# Patient Record
Sex: Female | Born: 1970 | Race: White | Hispanic: No | Marital: Single | Smoking: Never smoker
Health system: Southern US, Community
[De-identification: ages and names within clinical notes are randomized; demographics above are authoritative.]

## PROBLEM LIST (undated history)

## (undated) DIAGNOSIS — K219 Gastro-esophageal reflux disease without esophagitis: Secondary | ICD-10-CM

## (undated) HISTORY — DX: Gastro-esophageal reflux disease without esophagitis: K21.9

---

## 1998-01-29 ENCOUNTER — Other Ambulatory Visit: Admission: RE | Admit: 1998-01-29 | Discharge: 1998-01-29 | Payer: Self-pay | Admitting: Obstetrics & Gynecology

## 1999-07-29 ENCOUNTER — Other Ambulatory Visit: Admission: RE | Admit: 1999-07-29 | Discharge: 1999-07-29 | Payer: Self-pay | Admitting: Obstetrics & Gynecology

## 2006-10-19 ENCOUNTER — Emergency Department: Payer: Self-pay | Admitting: Emergency Medicine

## 2006-11-01 ENCOUNTER — Emergency Department: Payer: Self-pay | Admitting: General Practice

## 2006-11-02 ENCOUNTER — Ambulatory Visit: Payer: Self-pay | Admitting: Internal Medicine

## 2006-11-07 ENCOUNTER — Emergency Department: Payer: Self-pay | Admitting: Emergency Medicine

## 2006-11-07 ENCOUNTER — Other Ambulatory Visit: Payer: Self-pay

## 2006-11-18 ENCOUNTER — Ambulatory Visit: Payer: Self-pay | Admitting: Gastroenterology

## 2006-12-08 ENCOUNTER — Ambulatory Visit: Payer: Self-pay | Admitting: Internal Medicine

## 2008-07-24 ENCOUNTER — Ambulatory Visit: Payer: Self-pay

## 2009-09-03 ENCOUNTER — Ambulatory Visit: Payer: Self-pay

## 2009-11-25 ENCOUNTER — Ambulatory Visit: Payer: Self-pay | Admitting: Internal Medicine

## 2010-10-07 ENCOUNTER — Ambulatory Visit: Payer: Self-pay

## 2011-09-20 ENCOUNTER — Emergency Department: Payer: Self-pay | Admitting: Unknown Physician Specialty

## 2011-09-20 LAB — URINALYSIS, COMPLETE
Blood: NEGATIVE
Glucose,UR: NEGATIVE mg/dL (ref 0–75)
Ketone: NEGATIVE
Nitrite: NEGATIVE
Specific Gravity: 1.01 (ref 1.003–1.030)

## 2011-09-20 LAB — BASIC METABOLIC PANEL
Chloride: 109 mmol/L — ABNORMAL HIGH (ref 98–107)
Co2: 24 mmol/L (ref 21–32)
Creatinine: 0.63 mg/dL (ref 0.60–1.30)
EGFR (Non-African Amer.): >60
Glucose: 99 mg/dL (ref 65–99)
Potassium: 4.1 mmol/L (ref 3.5–5.1)
Sodium: 142 mmol/L (ref 136–145)

## 2011-09-20 LAB — CBC
HCT: 44.4 % (ref 35.0–47.0)
HGB: 14.8 g/dL (ref 12.0–16.0)
MCH: 32.3 pg (ref 26.0–34.0)
MCV: 97 fL (ref 80–100)
RBC: 4.59 10*6/uL (ref 3.80–5.20)

## 2011-09-20 LAB — PREGNANCY, URINE: Pregnancy Test, Urine: NEGATIVE m[IU]/mL

## 2011-10-13 ENCOUNTER — Ambulatory Visit: Payer: Self-pay

## 2012-10-21 ENCOUNTER — Ambulatory Visit: Payer: Self-pay

## 2013-10-24 ENCOUNTER — Ambulatory Visit: Payer: Self-pay

## 2014-11-22 ENCOUNTER — Other Ambulatory Visit: Payer: Self-pay | Admitting: Obstetrics and Gynecology

## 2014-11-22 DIAGNOSIS — Z1231 Encounter for screening mammogram for malignant neoplasm of breast: Secondary | ICD-10-CM

## 2014-12-05 ENCOUNTER — Ambulatory Visit
Admission: RE | Admit: 2014-12-05 | Discharge: 2014-12-05 | Disposition: A | Discharge status: Home / Self Care | Payer: BC Managed Care – PPO | Source: Ambulatory Visit | Attending: Obstetrics and Gynecology | Admitting: Obstetrics and Gynecology

## 2014-12-05 DIAGNOSIS — Z1231 Encounter for screening mammogram for malignant neoplasm of breast: Secondary | ICD-10-CM | POA: Diagnosis present

## 2014-12-05 DIAGNOSIS — R928 Other abnormal and inconclusive findings on diagnostic imaging of breast: Secondary | ICD-10-CM | POA: Insufficient documentation

## 2014-12-13 ENCOUNTER — Other Ambulatory Visit: Payer: Self-pay | Admitting: Obstetrics and Gynecology

## 2014-12-13 DIAGNOSIS — R928 Other abnormal and inconclusive findings on diagnostic imaging of breast: Secondary | ICD-10-CM

## 2014-12-21 ENCOUNTER — Ambulatory Visit: Payer: BC Managed Care – PPO

## 2014-12-21 ENCOUNTER — Ambulatory Visit
Admission: RE | Admit: 2014-12-21 | Discharge: 2014-12-21 | Disposition: A | Discharge status: Home / Self Care | Payer: BC Managed Care – PPO | Source: Ambulatory Visit | Attending: Obstetrics and Gynecology | Admitting: Obstetrics and Gynecology

## 2014-12-21 DIAGNOSIS — R928 Other abnormal and inconclusive findings on diagnostic imaging of breast: Secondary | ICD-10-CM | POA: Diagnosis present

## 2015-03-20 ENCOUNTER — Encounter: Payer: Self-pay | Admitting: Dietician

## 2015-03-20 ENCOUNTER — Encounter: Payer: BC Managed Care – PPO | Attending: Obstetrics and Gynecology | Admitting: Dietician

## 2015-03-20 VITALS — Ht 67.0 in | Wt 209.0 lb

## 2015-03-20 DIAGNOSIS — E669 Obesity, unspecified: Secondary | ICD-10-CM

## 2015-03-20 NOTE — Progress Notes (Signed)
Medical Nutrition Therapy: Visit start time: 8:15  end time: 9:15 Assessment:  Diagnosis: obesity Past medical history: GERD Psychosocial issues/ stress concerns: Patient rates her stress as moderate to high and indicates she is dealing "ok" with her stress. Preferred learning method:  . Auditory Current weight: 210.5 lbs  Height: 67 in Medications, supplements: see list Progress and evaluation:  Patient came for initial nutrition assessment. She reports that her weight has remained relatively stable over past 2 years. Reports highest weight of 260 lbs. approximately 4 years ago. 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 She reports that she gained some of the weight back through "poor food choices" and less activity. She reports that she has a very stressful job that also involves 2 hours of commute time daily. She expresses frustration because she states that presently she makes an effort to choose healthy foods but does not see reflected in weight loss. She rarely eats fried foods, likes and includes a variety of fruits/vegetables. She prepares and eats breakfast on her morning commute. She takes her lunch to work and 4 of 7 dinner meals are prepared at home. Patient reports a family history of diabetes and heart disease and wants to take steps to decrease risks. She states that for years, if a meal is delayed, she has symptoms of hypoglycemia. Physical activity: She reports she has not been consistent with exercise but may walk or bike 1-3x/week for 30-60 minutes  Dietary Intake:  Usual eating pattern includes 3 meals and 2 snacks per day. Dining out frequency: 4 meals per week.  Breakfast: boiled egg, [place] or chicken bacon, ww toast (1-2 slices), apple, coffee with stevia and half/half Snack: almonds or cheese Lunch: salad, lean protein; doesn't usually eat a starch, tea with  stevia or water Snack: almonds or cheese or trail mix pkg Supper: baked meat, starch such as ww pasta or brown rice, vegetable or legumes, tea or water Snack: 4-5 oz. Glass of wine 3-4 evenings/week Beverages: water, tea (with stevia), coffee  Nutrition Care Education: Weight control: Instructed on a meal plan to promote weight loss based on 1600 calories including carbohydrate counting, portion control and balance of carbohydrate, protein, non-starchy vegetables with emphasis also on low fat choices. Discussed also spacing of meals and snacks to help decrease episodes of low blood sugar. Also reviewed label reading. Nutritional Diagnosis:  [place]-3.3 Overweight/obesity As related to history of higher fat food choices, larger portions.  As evidenced by diet history.  Intervention:  Balance meals with protein, 2-3 servings of carbohydrate and non-starchy vegetables. Spread 10 servings of carbohydrate over 3 meals and 1-2 snacks. Low impact aerobic exercise- 5 days/week. Read labels for carbohydrate. Can subtract fiber. Include a serving of carbohydrate and 1 oz. of protein for afternoon snack.  Education Materials given:  Marland Kitchen Sample meal pattern/ menus . Goals/ instructions Learner/ who was taught:  . Patient  Level of understanding: . Partial understanding; needs review/ practice Learning barriers: . None Willingness to learn/ readiness for change: . Eager, change in progress  Monitoring and Evaluation:  Dietary intake, exercise, and body weight      follow up: 04/26/15 at 8:15

## 2015-03-20 NOTE — Patient Instructions (Signed)
Balance meals with protein, 2-3 servings of carbohydrate and non-starchy vegetables. Spread 10 servings of carbohydrate over 3 meals and 1-2 snacks. Low impact aerobic exercise- 5 days/week. Read labels for carbohydrate. Can subtract fiber. Include a serving of carbohydrate and 1 oz. of protein for afternoon snack.

## 2015-04-26 ENCOUNTER — Encounter: Payer: Self-pay | Admitting: Dietician

## 2015-04-26 ENCOUNTER — Encounter: Payer: BC Managed Care – PPO | Attending: Obstetrics and Gynecology | Admitting: Dietician

## 2015-04-26 VITALS — Ht 67.0 in | Wt 211.3 lb

## 2015-04-26 DIAGNOSIS — E669 Obesity, unspecified: Secondary | ICD-10-CM | POA: Diagnosis not present

## 2015-04-26 NOTE — Patient Instructions (Addendum)
Work to increase fiber with goal of 25-30 gms per day. Refer to list. Continue to work toward 5 days per week of exercise. Continue with previous goals.

## 2015-04-26 NOTE — Progress Notes (Signed)
Medical Nutrition Therapy Follow-up visit:  Time spent with patient:  8:30- 9:15am Visit #:2 ASSESSMENT:  Diagnosis:obesity  Current weight:211.3    Height: 67 in Medications: See list Medical History: GERD Progress and evaluation:  Patient in for medical nutrition therapy follow-up visit. She states she has used meal plan guide given and instructed on at previous visit. She states she does not always include 9-10 servings of carbohydrate daily. She has added some whole grain foods such as whole grain crackers (triscuits). She averages 4-5 servings of fruits/vegetables/day and her protein intake is adequate. Despite a good intake of fruits/vegetables, overall fiber intake is low due to lack of whole grains. She expressed frustration with lack of weight loss in past month but states that she feels high job stress and not being able to increase her exercise are factors. Pysical activity:2-3 days per week; walk or bikes for 30 mintues  NUTRITION CARE EDUCATION:  Weight control: Acknowledged that with patient's overall healthy eating habits, frustration regarding lack of weight loss is understandable. Discussed how with previous damage to her G-I track, how research is indicating that gut microbes play a role in obesity. 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 Gave and reviewed list of foods and fiber content.  Agree with patient that increased exercise will be necessary in order to be successful with weight loss. INTERVENTION: Work to increase fiber with goal of 25-30 gms per day. Refer to list. Continue to work toward 5 days per week of exercise. Continue with previous goals.   EDUCATION MATERIALS GIVEN:  . List of foods and fiber content . Goals/ instructions LEARNER/ who was taught:  . Patient  LEVEL OF UNDERSTANDING: . Partial understanding; needs review/ practice LEARNING  BARRIERS: . None WILLINGNESS TO LEARN/READINESS FOR CHANGE: . Eager, change in progress  MONITORING AND EVALUATION:  05/29/15 at 9:15am

## 2016-01-17 ENCOUNTER — Other Ambulatory Visit: Payer: Self-pay | Admitting: Obstetrics and Gynecology

## 2016-01-17 DIAGNOSIS — Z1231 Encounter for screening mammogram for malignant neoplasm of breast: Secondary | ICD-10-CM

## 2016-01-31 ENCOUNTER — Ambulatory Visit
Admission: RE | Admit: 2016-01-31 | Discharge: 2016-01-31 | Disposition: A | Discharge status: Home / Self Care | Payer: BC Managed Care – PPO | Source: Ambulatory Visit | Attending: Obstetrics and Gynecology | Admitting: Obstetrics and Gynecology

## 2016-01-31 ENCOUNTER — Other Ambulatory Visit: Payer: Self-pay | Admitting: Obstetrics and Gynecology

## 2016-01-31 DIAGNOSIS — Z1231 Encounter for screening mammogram for malignant neoplasm of breast: Secondary | ICD-10-CM

## 2016-01-31 DIAGNOSIS — R928 Other abnormal and inconclusive findings on diagnostic imaging of breast: Secondary | ICD-10-CM | POA: Diagnosis not present

## 2016-02-07 ENCOUNTER — Encounter: Payer: Self-pay | Admitting: Sports Medicine

## 2016-02-07 ENCOUNTER — Ambulatory Visit (INDEPENDENT_AMBULATORY_CARE_PROVIDER_SITE_OTHER): Payer: BC Managed Care – PPO

## 2016-02-07 ENCOUNTER — Ambulatory Visit (INDEPENDENT_AMBULATORY_CARE_PROVIDER_SITE_OTHER): Payer: BC Managed Care – PPO | Admitting: Sports Medicine

## 2016-02-07 DIAGNOSIS — M79672 Pain in left foot: Secondary | ICD-10-CM

## 2016-02-07 DIAGNOSIS — M722 Plantar fascial fibromatosis: Secondary | ICD-10-CM

## 2016-02-07 DIAGNOSIS — M7662 Achilles tendinitis, left leg: Secondary | ICD-10-CM

## 2016-02-07 MED ORDER — TRIAMCINOLONE ACETONIDE 40 MG/ML IJ SUSP: 20.0000 mg | Freq: Once | INTRAMUSCULAR | Status: AC

## 2016-02-07 MED ORDER — DICLOFENAC SODIUM 75 MG PO TBEC: 75.0000 mg | DELAYED_RELEASE_TABLET | Freq: Two times a day (BID) | ORAL | 0 refills | Status: DC

## 2016-02-07 MED ORDER — METHYLPREDNISOLONE 4 MG PO TBPK: ORAL_TABLET | ORAL | 0 refills | Status: DC

## 2016-02-07 NOTE — Progress Notes (Signed)
Subjective: Vanessa Hansen is a 45 y.o. female patient presents to office with complaint of heel pain on the left. 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 Patient has treated this problem with ice, stretching, changes shoes with no relief. Admits to history of braces as a child and states that she also has been doing more because she is exercising to lose weight. Denies any other pedal complaints.   There are no active problems to display for this patient.   Current Outpatient Prescriptions on File Prior to Visit  Medication Sig Dispense Refill  . albuterol (PROAIR HFA) 108 (90 BASE) MCG/ACT inhaler INHALE 2 PUFFS EVERY 4 HOURS AS NEEDED FOR FOR SHORTNESS OF BREATH OR WHEEZING    . norgestimate-ethinyl estradiol (ORTHO-CYCLEN,SPRINTEC,PREVIFEM) 0.25-35 MG-MCG tablet Take by mouth.    . promethazine (PHENERGAN) 25 MG tablet Take by mouth.     No current facility-administered medications on file prior to visit.     Allergies  Allergen Reactions  . Aspirin Other (See Comments)  . Clarithromycin Other (See Comments)  . Penicillin G Other (See Comments)    Objective: Physical Exam General: The patient is alert and oriented x3 in no acute distress.  Dermatology: Skin is warm, dry and supple bilateral lower extremities. Nails 1-10 are normal. There is no erythema, edema, no eccymosis, no open lesions present. Integument is otherwise unremarkable.  Vascular: Dorsalis Pedis pulse and Posterior Tibial pulse are 2/4 bilateral. Capillary fill time is immediate to all digits.  Neurological: Grossly intact to light touch with an achilles reflex of +2/5 and a  negative Tinel's sign bilateral.  Musculoskeletal: Tenderness to palpation at the medial calcaneal tubercale and through the insertion of the plantar fascia on the left and at achilles on left with no gap/dell/nodule. No  pain with compression of calcaneus bilateral. No pain with tuning fork to calcaneus bilateral. No pain with calf compression bilateral. There is decreased Ankle joint range of motion bilateral left>right. All other joints range of motion within normal limits bilateral. Strength 5/5 in all groups bilateral. Mild asymptomatic hammertoe.    Xray, Left foot:  Normal osseous mineralization. Joint spaces preserved. No fracture/dislocation/boney destruction. Hammertoe. Calcaneal spur present with mild thickening of plantar fascia. No other soft tissue abnormalities or radiopaque foreign bodies.   Assessment and Plan: Problem List Items Addressed This Visit    None    Visit Diagnoses    Left foot pain    -  Primary   Relevant Medications   triamcinolone acetonide (KENALOG-40) injection 20 mg   methylPREDNISolone (MEDROL DOSEPAK) 4 MG TBPK tablet   diclofenac (VOLTAREN) 75 MG EC tablet   Other Relevant Orders   DG Foot 2 Views Left   Plantar fasciitis of left foot       Relevant Medications   triamcinolone acetonide (KENALOG-40) injection 20 mg   methylPREDNISolone (MEDROL DOSEPAK) 4 MG TBPK tablet   diclofenac (VOLTAREN) 75 MG EC tablet   Tendonitis, Achilles, left       Relevant Medications   triamcinolone acetonide (KENALOG-40) injection 20 mg   methylPREDNISolone (MEDROL DOSEPAK) 4 MG TBPK tablet   diclofenac (VOLTAREN) 75 MG EC tablet     -Complete examination performed.  -Xrays reviewed -Discussed with patient in detail the condition of plantar fasciitis and achilles tendonitis, how this occurs and general treatment options. Explained both conservative and surgical treatments.  -After oral  consent and aseptic prep, injected a mixture containing 1 ml of 2%  plain lidocaine, 1 ml 0.5% plain marcaine, 0.5 ml of kenalog 40 and 0.5 ml of dexamethasone phosphate into left heel at plantar fascia Post-injection care discussed with patient.  -Rx Diclofenac to start after Medrol dose pack is  completed -Recommended good supportive shoes and advised use of OTC insert. Explained to patient that if these orthoses work well, we will continue with these. If these do not improve her condition and  pain, we will consider custom molded orthoses. - Explained in detail the use of the fascial brace which was dispensed at today's visit. -Explained and dispensed to patient daily stretching exercises. -Recommend patient to ice affected area 1-2x daily. -Patient to return to office in 3 weeks for follow up or sooner if problems or questions arise. Will give heel lift and night splint at next visit if pain is still present at achilles.   Asencion Islam, DPM

## 2016-02-07 NOTE — Patient Instructions (Addendum)
Achilles Tendinitis Achilles tendinitis is inflammation of the tough, cord-like band that attaches the lower muscles of your leg to your heel (Achilles tendon). It is usually caused by overusing the tendon and joint involved.  CAUSES Achilles tendinitis can happen because of:  A sudden increase in exercise or activity (such as running).  Doing the same exercises or activities (such as jumping) over and over.  Not warming up calf muscles before exercising.  Exercising in shoes that are worn out or not made for exercise.  Having arthritis or a bone growth on the back of the heel bone. This can rub against the tendon and hurt the tendon. SIGNS AND SYMPTOMS The most common symptoms are:  Pain in the back of the leg, just above the heel. The pain usually gets worse with exercise and better with rest.  Stiffness or soreness in the back of the leg, especially in the morning.  Swelling of the skin over the Achilles tendon.  Trouble standing on tiptoe. Sometimes, an Achilles tendon tears (ruptures). Symptoms of an Achilles tendon rupture can include:  Sudden, severe pain in the back of the leg.  Trouble putting weight on the foot or walking normally. DIAGNOSIS Achilles tendinitis will be diagnosed based on symptoms and a physical examination. An X-ray may be done to check if another condition is causing your symptoms. An MRI may be ordered if your health care provider suspects you may have completely torn your tendon, which is called an Achilles tendon rupture.  TREATMENT  Achilles tendinitis usually gets better over time. It can take weeks to months to heal completely. Treatment focuses on treating the symptoms and helping the injury heal. HOME CARE INSTRUCTIONS   Rest your Achilles tendon and avoid activities that cause pain.  Apply ice to the injured area:  Put ice in a plastic bag.  Place a towel between your skin and the bag.  Leave the ice on for 20 minutes, 2-3 times a  day  Try to avoid using the tendon (other than gentle range of motion) while the tendon is painful. Do not resume use until instructed by your health care provider. Then begin use gradually. Do not increase use to the point of pain. If pain does develop, decrease use and continue the above measures. Gradually increase activities that do not cause discomfort until you achieve normal use.  Do exercises to make your calf muscles stronger and more flexible. Your health care provider or physical therapist can recommend exercises for you to do.  Wrap your ankle with an elastic bandage or other wrap. This can help keep your tendon from moving too much. Your health care provider will show you how to wrap your ankle correctly.  Only take over-the-counter or prescription medicines for pain, discomfort, or fever as directed by your health care provider. SEEK MEDICAL CARE IF:   Your pain and swelling increase or pain is uncontrolled with medicines.  You develop new, unexplained symptoms or your symptoms get worse.  You are unable to move your toes or foot.  You develop warmth and swelling in your foot.  You have an unexplained temperature. MAKE SURE YOU:   Understand these instructions.  Will watch your condition.  Will get help right away if you are not doing well or get worse.   This information is not intended to replace advice given to you by your health care provider. Make sure you discuss any questions you have with your health care provider.   Document Released:   04/01/2005 Document Revised: 07/13/2014 Document Reviewed: 02/01/2013 Elsevier Interactive Patient Education 2016 [employer]  Plantar Fasciitis With Rehab The plantar fascia is a fibrous, ligament-like, soft-tissue structure that spans the bottom of the foot. Plantar fasciitis, also called heel spur syndrome, is a condition that causes pain in the foot due to inflammation of the tissue. SYMPTOMS   Pain and tenderness on  the underneath side of the foot.  Pain that worsens with standing or walking. CAUSES  Plantar fasciitis is caused by irritation and injury to the plantar fascia on the underneath side of the foot. Common mechanisms of injury include:  Direct trauma to bottom of the foot.  Damage to a small nerve that runs under the foot where the main fascia attaches to the heel bone.  Stress placed on the plantar fascia due to bone spurs. RISK INCREASES WITH:   Activities that place stress on the plantar fascia (running, jumping, pivoting, or cutting).  Poor strength and flexibility.  Improperly fitted shoes.  Tight calf muscles.  Flat feet.  Failure to warm-up properly before activity.  Obesity. PREVENTION  Warm up and stretch properly before activity.  Allow for adequate recovery between workouts.  Maintain physical fitness:  Strength, flexibility, and endurance.  Cardiovascular fitness.  Maintain a health body weight.  Avoid stress on the plantar fascia.  Wear properly fitted shoes, including arch supports for individuals who have flat feet. PROGNOSIS  If treated properly, then the symptoms of plantar fasciitis usually resolve without surgery. However, occasionally surgery is necessary. RELATED COMPLICATIONS   Recurrent symptoms that may result in a chronic condition.  Problems of the lower back that are caused by compensating for the injury, such as limping.  Pain or weakness of the foot during push-off following surgery.  Chronic inflammation, scarring, and partial or complete fascia tear, occurring more often from repeated injections. TREATMENT  Treatment initially involves the use of ice and medication to help reduce pain and inflammation. The use of strengthening and stretching exercises may help reduce pain with activity, especially stretches of the Achilles tendon. These exercises may be performed at home or with a therapist. Your caregiver may recommend that you use  heel cups of arch supports to help reduce stress on the plantar fascia. Occasionally, corticosteroid injections are given to reduce inflammation. If symptoms persist for greater than 6 months despite non-surgical (conservative), then surgery may be recommended.  MEDICATION   If pain medication is necessary, then nonsteroidal anti-inflammatory medications, such as aspirin and ibuprofen, or other minor pain relievers, such as acetaminophen, are often recommended.  Do not take pain medication within 7 days before surgery.  Prescription pain relievers may be given if deemed necessary by your caregiver. Use only as directed and only as much as you need.  Corticosteroid injections may be given by your caregiver. These injections should be reserved for the most serious cases, because they may only be given a certain number of times. HEAT AND COLD  Cold treatment (icing) relieves pain and reduces inflammation. Cold treatment should be applied for 10 to 15 minutes every 2 to 3 hours for inflammation and pain and immediately after any activity that aggravates your symptoms. Use ice packs or massage the area with a piece of ice (ice massage).  Heat treatment may be used prior to performing the stretching and strengthening activities prescribed by your caregiver, physical therapist, or athletic trainer. Use a heat pack or soak the injury in warm water. SEEK IMMEDIATE MEDICAL CARE IF:  Treatment   seems to offer no benefit, or the condition worsens.  Any medications produce adverse side effects. EXERCISES RANGE OF MOTION (ROM) AND STRETCHING EXERCISES - Plantar Fasciitis (Heel Spur Syndrome) These exercises may help you when beginning to rehabilitate your injury. Your symptoms may resolve with or without further involvement from your physician, physical therapist or athletic trainer. While completing these exercises, remember:   Restoring tissue flexibility helps normal motion to return to the joints. This  allows healthier, less painful movement and activity.  An effective stretch should be held for at least 30 seconds.  A stretch should never be painful. You should only feel a gentle lengthening or release in the stretched tissue. RANGE OF MOTION - Toe Extension, Flexion  Sit with your right / left leg crossed over your opposite knee.  Grasp your toes and gently pull them back toward the top of your foot. You should feel a stretch on the bottom of your toes and/or foot.  Hold this stretch for __________ seconds.  Now, gently pull your toes toward the bottom of your foot. You should feel a stretch on the top of your toes and or foot.  Hold this stretch for __________ seconds. Repeat __________ times. Complete this stretch __________ times per day.  RANGE OF MOTION - Ankle Dorsiflexion, Active Assisted  Remove shoes and sit on a chair that is preferably not on a carpeted surface.  Place right / left foot under knee. Extend your opposite leg for support.  Keeping your heel down, slide your right / left foot back toward the chair until you feel a stretch at your ankle or calf. If you do not feel a stretch, slide your bottom forward to the edge of the chair, while still keeping your heel down.  Hold this stretch for __________ seconds. Repeat __________ times. Complete this stretch __________ times per day.  STRETCH - Gastroc, Standing  Place hands on wall.  Extend right / left leg, keeping the front knee somewhat bent.  Slightly point your toes inward on your back foot.  Keeping your right / left heel on the floor and your knee straight, shift your weight toward the wall, not allowing your back to arch.  You should feel a gentle stretch in the right / left calf. Hold this position for __________ seconds. Repeat __________ times. Complete this stretch __________ times per day. STRETCH - Soleus, Standing  Place hands on wall.  Extend right / left leg, keeping the other knee  somewhat bent.  Slightly point your toes inward on your back foot.  Keep your right / left heel on the floor, bend your back knee, and slightly shift your weight over the back leg so that you feel a gentle stretch deep in your back calf.  Hold this position for __________ seconds. Repeat __________ times. Complete this stretch __________ times per day. STRETCH - Gastrocsoleus, Standing  Note: This exercise can place a lot of stress on your foot and ankle. Please complete this exercise only if specifically instructed by your caregiver.   Place the ball of your right / left foot on a step, keeping your other foot firmly on the same step.  Hold on to the wall or a rail for balance.  Slowly lift your other foot, allowing your body weight to press your heel down over the edge of the step.  You should feel a stretch in your right / left calf.  Hold this position for __________ seconds.  Repeat this exercise with a   slight bend in your right / left knee. Repeat __________ times. Complete this stretch __________ times per day.  STRENGTHENING EXERCISES - Plantar Fasciitis (Heel Spur Syndrome)  These exercises may help you when beginning to rehabilitate your injury. They may resolve your symptoms with or without further involvement from your physician, physical therapist or athletic trainer. While completing these exercises, remember:   Muscles can gain both the endurance and the strength needed for everyday activities through controlled exercises.  Complete these exercises as instructed by your physician, physical therapist or athletic trainer. Progress the resistance and repetitions only as guided. STRENGTH - Towel Curls  Sit in a chair positioned on a non-carpeted surface.  Place your foot on a towel, keeping your heel on the floor.  Pull the towel toward your heel by only curling your toes. Keep your heel on the floor.  If instructed by your physician, physical therapist or athletic  trainer, add ____________________ at the end of the towel. Repeat __________ times. Complete this exercise __________ times per day. STRENGTH - Ankle Inversion  Secure one end of a rubber exercise band/tubing to a fixed object (table, pole). Loop the other end around your foot just before your toes.  Place your fists between your knees. This will focus your strengthening at your ankle.  Slowly, pull your big toe up and in, making sure the band/tubing is positioned to resist the entire motion.  Hold this position for __________ seconds.  Have your muscles resist the band/tubing as it slowly pulls your foot back to the starting position. Repeat __________ times. Complete this exercises __________ times per day.    This information is not intended to replace advice given to you by your health care provider. Make sure you discuss any questions you have with your health care provider.   Document Released: 06/22/2005 Document Revised: 11/06/2014 Document Reviewed: 10/04/2008 Elsevier Interactive Patient Education 2016 [employer]   

## 2016-02-14 ENCOUNTER — Other Ambulatory Visit: Payer: Self-pay | Admitting: Obstetrics and Gynecology

## 2016-02-14 DIAGNOSIS — N6489 Other specified disorders of breast: Secondary | ICD-10-CM

## 2016-02-18 ENCOUNTER — Other Ambulatory Visit: Payer: Self-pay | Admitting: Sports Medicine

## 2016-02-18 DIAGNOSIS — M79672 Pain in left foot: Secondary | ICD-10-CM

## 2016-02-18 DIAGNOSIS — M722 Plantar fascial fibromatosis: Secondary | ICD-10-CM

## 2016-02-18 DIAGNOSIS — M7662 Achilles tendinitis, left leg: Secondary | ICD-10-CM

## 2016-02-21 ENCOUNTER — Ambulatory Visit
Admission: RE | Admit: 2016-02-21 | Discharge: 2016-02-21 | Disposition: A | Discharge status: Home / Self Care | Payer: BC Managed Care – PPO | Source: Ambulatory Visit | Attending: Obstetrics and Gynecology | Admitting: Obstetrics and Gynecology

## 2016-02-21 DIAGNOSIS — N6489 Other specified disorders of breast: Secondary | ICD-10-CM

## 2016-02-21 DIAGNOSIS — R928 Other abnormal and inconclusive findings on diagnostic imaging of breast: Secondary | ICD-10-CM | POA: Insufficient documentation

## 2016-02-28 ENCOUNTER — Encounter: Payer: Self-pay | Admitting: Sports Medicine

## 2016-02-28 ENCOUNTER — Ambulatory Visit (INDEPENDENT_AMBULATORY_CARE_PROVIDER_SITE_OTHER): Payer: BC Managed Care – PPO | Admitting: Sports Medicine

## 2016-02-28 DIAGNOSIS — M79672 Pain in left foot: Secondary | ICD-10-CM | POA: Diagnosis not present

## 2016-02-28 DIAGNOSIS — M722 Plantar fascial fibromatosis: Secondary | ICD-10-CM

## 2016-02-28 DIAGNOSIS — M7662 Achilles tendinitis, left leg: Secondary | ICD-10-CM | POA: Diagnosis not present

## 2016-02-28 MED ORDER — DICLOFENAC SODIUM 75 MG PO TBEC: 75.0000 mg | DELAYED_RELEASE_TABLET | Freq: Two times a day (BID) | ORAL | 0 refills | Status: AC

## 2016-02-28 MED ORDER — TRIAMCINOLONE ACETONIDE 40 MG/ML IJ SUSP: 20.0000 mg | Freq: Once | INTRAMUSCULAR | Status: AC

## 2016-02-28 NOTE — Progress Notes (Signed)
Subjective: Vanessa Hansen is a 45 y.o. female returns to office for follow up evaluation after Left heel injection for plantar fasciitis, injection #1 administered 3 weeks ago. Patient states that the injection seems to help her pain; pain is 60-70% better and has  decreased in frequency to the area. Patient denies any recent changes in medications or new problems since last visit.   There are no active problems to display for this patient.   Current Outpatient Prescriptions on File Prior to Visit  Medication Sig Dispense Refill  . albuterol (PROAIR HFA) 108 (90 BASE) MCG/ACT inhaler INHALE 2 PUFFS EVERY 4 HOURS AS NEEDED FOR FOR SHORTNESS OF BREATH OR WHEEZING    . methylPREDNISolone (MEDROL DOSEPAK) 4 MG TBPK tablet Take 1st as instructed 21 tablet 0  . norgestimate-ethinyl estradiol (ORTHO-CYCLEN,SPRINTEC,PREVIFEM) 0.25-35 MG-MCG tablet Take by mouth.    . promethazine (PHENERGAN) 25 MG tablet Take by mouth.     Current Facility-Administered Medications on File Prior to Visit  Medication Dose Route Frequency Provider Last Rate Last Dose  . triamcinolone acetonide (KENALOG-40) injection 20 mg  20 mg Other Once Asencion Islamitorya Ardyth Kelso, DPM        Allergies  Allergen Reactions  . Aspirin Other (See Comments)  . Clarithromycin Other (See Comments)  . Penicillin G Other (See Comments)    Objective:   General:  Alert and oriented x 3, in no acute distress  Dermatology: Skin is warm, dry, and supple bilateral. Nails are within normal limits. There is no lower extremity erythema, no eccymosis, no open lesions present bilateral.   Vascular: Dorsalis Pedis and Posterior Tibial pedal pulses are 2/4 bilateral. + hair growth noted bilateral. Capillary Fill Time is 3 seconds in all digits. No varicosities, No edema bilateral lower extremities.   Neurological: Sensation grossly intact to light touch with an achilles reflex of +2 and a  negative Tinel's sign bilateral. Vibratory, sharp/dull, Semmes  Weinstein Monofilament within normal limits.   Musculoskeletal: There is decreased tenderness to palpation at the medial calcaneal tubercale and through the insertion of the plantar fascia on the Left foot. There is no tenderness to palpation to the left Achilles. There is no Dell or nodules. Thompson test remains negative. No pain with compression to calcaneus or application of tuning fork. There is decreased Ankle joint range of motion bilateral. All other jointsrange of motion  within normal limits bilateral. Strength 5/5 bilateral.   Assessment and Plan: Problem List Items Addressed This Visit    None    Visit Diagnoses    Plantar fasciitis of left foot    -  Primary   Relevant Medications   triamcinolone acetonide (KENALOG-40) injection 20 mg (Start on 02/28/2016  9:00 AM)   diclofenac (VOLTAREN) 75 MG EC tablet   Tendonitis, Achilles, left       Relevant Medications   diclofenac (VOLTAREN) 75 MG EC tablet   Left foot pain       Relevant Medications   diclofenac (VOLTAREN) 75 MG EC tablet      -Complete examination performed.  -Previous x-rays reviewed. -Discussed with patient in detail the condition of plantar fasciitisAnd Achilles tendinitis, how this occurs related to the foot type of the patient and general treatment options. - Patient opted for another injection today; injection #2 At plantar fascia,  After oral consent and aseptic prep, injected a mixture containing 0.5 ml of 1%plain lidocaine, 0.5 ml 0.25% plain marcaine, 0.5 ml of kenalog 40 and 0.5 ml of dexmethasone phosphate to  left heel at area of most pain/trigger point injection. -Dispensed night splint -Refilled diclofenac -Continue with fascial brace, heel lift, stretching, icing, good supportive shoes daily.  -Discussed long term care and reocurrence; will closely monitor; if fails to improve will consider other treatment modalities, MRI/PT/EPAT/Surgery -Patient to return to office in 2-3 weeks for follow up or  sooner if problems or questions arise.  Asencion Islam, DPM

## 2016-03-24 ENCOUNTER — Encounter: Payer: Self-pay | Admitting: Podiatry

## 2016-03-24 ENCOUNTER — Ambulatory Visit (INDEPENDENT_AMBULATORY_CARE_PROVIDER_SITE_OTHER): Payer: BC Managed Care – PPO | Admitting: Podiatry

## 2016-03-24 DIAGNOSIS — M79672 Pain in left foot: Secondary | ICD-10-CM

## 2016-03-24 DIAGNOSIS — M722 Plantar fascial fibromatosis: Secondary | ICD-10-CM

## 2016-03-24 MED ORDER — BETAMETHASONE SOD PHOS & ACET 6 (3-3) MG/ML IJ SUSP: 12.0000 mg | Freq: Once | INTRAMUSCULAR | Status: AC

## 2016-03-24 NOTE — Patient Instructions (Signed)
Plantar Fasciitis Plantar fasciitis is a painful foot condition that affects the heel. It occurs when the band of tissue that connects the toes to the heel bone (plantar fascia) becomes irritated. This can happen after exercising too much or doing other repetitive activities (overuse injury). The pain from plantar fasciitis can range from mild irritation to severe pain that makes it difficult for you to walk or move. The pain is usually worse in the morning or after you have been sitting or lying down for a while. CAUSES This condition may be caused by:  Standing for long periods of time.  Wearing shoes that do not fit.  Doing high-impact activities, including running, aerobics, and ballet.  Being overweight.  Having an abnormal way of walking (gait).  Having tight calf muscles.  Having high arches in your feet.  Starting a new athletic activity. SYMPTOMS The main symptom of this condition is heel pain. Other symptoms include:  Pain that gets worse after activity or exercise.  Pain that is worse in the morning or after resting.  Pain that goes away after you walk for a few minutes. DIAGNOSIS This condition may be diagnosed based on your signs and symptoms. Your health care provider will also do a physical exam to check for:  A tender area on the bottom of your foot.  A high arch in your foot.  Pain when you move your foot.  Difficulty moving your foot. You may also need to have imaging studies to confirm the diagnosis. These can include:  X-rays.  Ultrasound.  MRI. TREATMENT  Treatment for plantar fasciitis depends on the severity of the condition. Your treatment may include:  Rest, ice, and over-the-counter pain medicines to manage your pain.  Exercises to stretch your calves and your plantar fascia.  A splint that holds your foot in a stretched, upward position while you sleep (night splint).  Physical therapy to relieve symptoms and prevent problems in the  future.  Cortisone injections to relieve severe pain.  Extracorporeal shock wave therapy (ESWT) to stimulate damaged plantar fascia with electrical impulses. It is often used as a last resort before surgery.  Surgery, if other treatments have not worked after 12 months. HOME CARE INSTRUCTIONS  Take medicines only as directed by your health care provider.  Avoid activities that cause pain.  Roll the bottom of your foot over a bag of ice or a bottle of cold water. Do this for 20 minutes, 3-4 times a day.  Perform simple stretches as directed by your health care provider.  Try wearing athletic shoes with air-sole or gel-sole cushions or soft shoe inserts.  Wear a night splint while sleeping, if directed by your health care provider.  Keep all follow-up appointments with your health care provider. PREVENTION   Do not perform exercises or activities that cause heel pain.  Consider finding low-impact activities if you continue to have problems.  Lose weight if you need to. The best way to prevent plantar fasciitis is to avoid the activities that aggravate your plantar fascia. SEEK MEDICAL CARE IF:  Your symptoms do not go away after treatment with home care measures.  Your pain gets worse.  Your pain affects your ability to move or do your daily activities.   This information is not intended to replace advice given to you by your health care provider. Make sure you discuss any questions you have with your health care provider.   Document Released: 03/17/2001 Document Revised: 03/13/2015 Document Reviewed: 05/02/2014 Elsevier   Interactive Patient Education 2016 [employer]  

## 2016-03-24 NOTE — Progress Notes (Signed)
Subjective: Patient presents today for follow-up evaluation of left foot plantar fasciitis. Patient was last seen 3 weeks ago by Dr. Marylene LandStover. Patient states that between the last visit and today she is progressively gotten worse. Patient wants aggressive conservative therapy, however she mentioned she does not want surgical intervention. Patient presents today for further workup and evaluation and treatment.   Objective: Physical Exam General: The patient is alert and oriented x3 in no acute distress.  Dermatology: Skin is warm, dry and supple bilateral lower extremities. Negative for open lesions or macerations bilateral.   Vascular: Dorsalis Pedis and Posterior Tibial pulses palpable bilateral.  Capillary fill time is immediate to all digits.  Neurological: Epicritic and protective threshold intact bilateral.   Musculoskeletal: Tenderness to palpation at the medial calcaneal tubercale and through the insertion of the plantar fascia of the left foot. All other joints range of motion within normal limits bilateral. Strength 5/5 in all groups bilateral.   Assessment: #1 plantar fasciitis left foot #2 pain in left foot Problem List Items Addressed This Visit    None    Visit Diagnoses   None.      Plan of Care:   1. Patient evaluated. Xrays reviewed.   2. Injection of 0.5cc Celestone soluspan injected into the left plantar fascia.  3. Instructed patient regarding therapies and modalities at home to alleviate symptoms.  4. Rx for Ibuprofen800 with stomach protector given to patient via [facility]. Discontinue diclofenac. 5. Continue plantar fascial band and night splint daily. A new plantar fascial band was dispensed today. 6. Patient is to wear an immobilizing cam boot for the next 4 weeks.  7. Return to clinic in 4 weeks.

## 2016-04-24 ENCOUNTER — Ambulatory Visit (INDEPENDENT_AMBULATORY_CARE_PROVIDER_SITE_OTHER): Payer: BC Managed Care – PPO | Admitting: Podiatry

## 2016-04-24 DIAGNOSIS — M79672 Pain in left foot: Secondary | ICD-10-CM

## 2016-04-24 DIAGNOSIS — M722 Plantar fascial fibromatosis: Secondary | ICD-10-CM | POA: Diagnosis not present

## 2016-04-25 MED ORDER — BETAMETHASONE SOD PHOS & ACET 6 (3-3) MG/ML IJ SUSP: 12.0000 mg | Freq: Once | INTRAMUSCULAR | Status: AC

## 2016-04-25 NOTE — Progress Notes (Signed)
Subjective: Patient presents today for pain and tenderness in the left foot. Patient states the foot pain has been hurting for several weeks now. Patient states that it hurts in the mornings with the first steps out of bed. Patient presents today for further treatment and evaluation  Objective: Physical Exam General: The patient is alert and oriented x3 in no acute distress.  Dermatology: Skin is warm, dry and supple bilateral lower extremities. Negative for open lesions or macerations bilateral.   Vascular: Dorsalis Pedis and Posterior Tibial pulses palpable bilateral.  Capillary fill time is immediate to all digits.  Neurological: Epicritic and protective threshold intact bilateral.   Musculoskeletal: Tenderness to palpation at the medial calcaneal tubercale and through the insertion of the plantar fascia of the left foot. All other joints range of motion within normal limits bilateral. Strength 5/5 in all groups bilateral.   Radiographic exam:   Normal osseous mineralization. Joint spaces preserved. No fracture/dislocation/boney destruction. Calcaneal spur present with mild thickening of plantar fascia left. No other soft tissue abnormalities or radiopaque foreign bodies.   Assessment: 1. Plantar fasciitis left foot 2. Pain in left foot  Plan of Care:   1. Patient evaluated. Xrays reviewed.   2. Injection of 0.5cc Celestone soluspan injected into the left plantar fascia.  3. Instructed patient regarding therapies and modalities at home to alleviate symptoms.  4. Continue Duexis 5. Begin to transition from CAM boot to a good supportive shoe with plantar fascial brace. 6. Return to clinic in 4 weeks.

## 2016-05-26 ENCOUNTER — Ambulatory Visit: Payer: BC Managed Care – PPO | Admitting: Podiatry

## 2017-01-29 ENCOUNTER — Other Ambulatory Visit: Payer: Self-pay | Admitting: Obstetrics & Gynecology

## 2017-01-29 DIAGNOSIS — Z1231 Encounter for screening mammogram for malignant neoplasm of breast: Secondary | ICD-10-CM

## 2017-02-19 ENCOUNTER — Ambulatory Visit
Admission: RE | Admit: 2017-02-19 | Discharge: 2017-02-19 | Disposition: A | Discharge status: Home / Self Care | Payer: BC Managed Care – PPO | Source: Ambulatory Visit | Attending: Obstetrics & Gynecology | Admitting: Obstetrics & Gynecology

## 2017-02-19 DIAGNOSIS — Z1231 Encounter for screening mammogram for malignant neoplasm of breast: Secondary | ICD-10-CM | POA: Insufficient documentation

## 2018-02-08 ENCOUNTER — Other Ambulatory Visit: Payer: Self-pay | Admitting: Obstetrics & Gynecology

## 2018-02-08 DIAGNOSIS — Z1231 Encounter for screening mammogram for malignant neoplasm of breast: Secondary | ICD-10-CM

## 2018-03-18 ENCOUNTER — Ambulatory Visit
Admission: RE | Admit: 2018-03-18 | Discharge: 2018-03-18 | Disposition: A | Discharge status: Home / Self Care | Payer: BC Managed Care – PPO | Source: Ambulatory Visit | Attending: Obstetrics & Gynecology | Admitting: Obstetrics & Gynecology

## 2018-03-18 DIAGNOSIS — Z1231 Encounter for screening mammogram for malignant neoplasm of breast: Secondary | ICD-10-CM | POA: Insufficient documentation

## 2019-02-14 ENCOUNTER — Other Ambulatory Visit: Payer: Self-pay | Admitting: Obstetrics & Gynecology

## 2019-02-14 DIAGNOSIS — Z1231 Encounter for screening mammogram for malignant neoplasm of breast: Secondary | ICD-10-CM

## 2019-03-24 ENCOUNTER — Ambulatory Visit
Admission: RE | Admit: 2019-03-24 | Discharge: 2019-03-24 | Disposition: A | Discharge status: Home / Self Care | Payer: BC Managed Care – PPO | Source: Ambulatory Visit | Attending: Obstetrics & Gynecology | Admitting: Obstetrics & Gynecology

## 2019-03-24 DIAGNOSIS — Z1231 Encounter for screening mammogram for malignant neoplasm of breast: Secondary | ICD-10-CM | POA: Insufficient documentation

## 2019-03-28 ENCOUNTER — Other Ambulatory Visit: Payer: Self-pay | Admitting: Obstetrics & Gynecology

## 2019-03-28 DIAGNOSIS — R921 Mammographic calcification found on diagnostic imaging of breast: Secondary | ICD-10-CM

## 2019-03-28 DIAGNOSIS — R928 Other abnormal and inconclusive findings on diagnostic imaging of breast: Secondary | ICD-10-CM

## 2019-04-05 ENCOUNTER — Ambulatory Visit
Admission: RE | Admit: 2019-04-05 | Discharge: 2019-04-05 | Disposition: A | Discharge status: Home / Self Care | Payer: BC Managed Care – PPO | Source: Ambulatory Visit | Attending: Obstetrics & Gynecology | Admitting: Obstetrics & Gynecology

## 2019-04-05 DIAGNOSIS — R928 Other abnormal and inconclusive findings on diagnostic imaging of breast: Secondary | ICD-10-CM | POA: Diagnosis not present

## 2019-04-05 DIAGNOSIS — R921 Mammographic calcification found on diagnostic imaging of breast: Secondary | ICD-10-CM

## 2019-04-10 ENCOUNTER — Other Ambulatory Visit: Payer: Self-pay | Admitting: Obstetrics & Gynecology

## 2019-04-10 DIAGNOSIS — R921 Mammographic calcification found on diagnostic imaging of breast: Secondary | ICD-10-CM

## 2019-04-10 DIAGNOSIS — R928 Other abnormal and inconclusive findings on diagnostic imaging of breast: Secondary | ICD-10-CM

## 2019-04-17 ENCOUNTER — Ambulatory Visit
Admission: RE | Admit: 2019-04-17 | Discharge: 2019-04-17 | Disposition: A | Discharge status: Home / Self Care | Payer: BC Managed Care – PPO | Source: Ambulatory Visit | Attending: Obstetrics & Gynecology | Admitting: Obstetrics & Gynecology

## 2019-04-17 DIAGNOSIS — R921 Mammographic calcification found on diagnostic imaging of breast: Secondary | ICD-10-CM

## 2019-04-17 DIAGNOSIS — R928 Other abnormal and inconclusive findings on diagnostic imaging of breast: Secondary | ICD-10-CM

## 2019-04-17 HISTORY — PX: BREAST BIOPSY: SHX20

## 2019-04-19 LAB — SURGICAL PATHOLOGY

## 2020-02-15 ENCOUNTER — Other Ambulatory Visit: Payer: Self-pay | Admitting: Obstetrics & Gynecology

## 2020-02-15 DIAGNOSIS — Z1231 Encounter for screening mammogram for malignant neoplasm of breast: Secondary | ICD-10-CM

## 2020-03-26 ENCOUNTER — Ambulatory Visit
Admission: RE | Admit: 2020-03-26 | Discharge: 2020-03-26 | Disposition: A | Discharge status: Home / Self Care | Payer: BC Managed Care – PPO | Source: Ambulatory Visit | Attending: Obstetrics & Gynecology | Admitting: Obstetrics & Gynecology

## 2020-03-26 ENCOUNTER — Other Ambulatory Visit: Payer: Self-pay

## 2020-03-26 DIAGNOSIS — Z1231 Encounter for screening mammogram for malignant neoplasm of breast: Secondary | ICD-10-CM | POA: Insufficient documentation

## 2021-02-19 ENCOUNTER — Other Ambulatory Visit: Payer: Self-pay | Admitting: Internal Medicine

## 2021-02-19 DIAGNOSIS — Z1231 Encounter for screening mammogram for malignant neoplasm of breast: Secondary | ICD-10-CM

## 2021-04-03 ENCOUNTER — Other Ambulatory Visit: Payer: Self-pay

## 2021-04-03 ENCOUNTER — Ambulatory Visit
Admission: RE | Admit: 2021-04-03 | Discharge: 2021-04-03 | Disposition: A | Discharge status: Home / Self Care | Payer: BC Managed Care – PPO | Source: Ambulatory Visit | Attending: Internal Medicine | Admitting: Internal Medicine

## 2021-04-03 DIAGNOSIS — Z1231 Encounter for screening mammogram for malignant neoplasm of breast: Secondary | ICD-10-CM | POA: Diagnosis present

## 2022-02-26 ENCOUNTER — Other Ambulatory Visit: Payer: Self-pay

## 2022-02-26 DIAGNOSIS — Z1231 Encounter for screening mammogram for malignant neoplasm of breast: Secondary | ICD-10-CM

## 2022-04-06 ENCOUNTER — Ambulatory Visit
Admission: RE | Admit: 2022-04-06 | Discharge: 2022-04-06 | Disposition: A | Discharge status: Home / Self Care | Payer: BC Managed Care – PPO | Source: Ambulatory Visit

## 2022-04-06 DIAGNOSIS — Z1231 Encounter for screening mammogram for malignant neoplasm of breast: Secondary | ICD-10-CM | POA: Insufficient documentation

## 2023-03-02 ENCOUNTER — Other Ambulatory Visit: Payer: Self-pay

## 2023-03-02 DIAGNOSIS — Z1231 Encounter for screening mammogram for malignant neoplasm of breast: Secondary | ICD-10-CM

## 2023-04-16 ENCOUNTER — Ambulatory Visit
Admission: RE | Admit: 2023-04-16 | Discharge: 2023-04-16 | Disposition: A | Discharge status: Home / Self Care | Payer: BC Managed Care – PPO | Source: Ambulatory Visit

## 2023-04-16 DIAGNOSIS — Z1231 Encounter for screening mammogram for malignant neoplasm of breast: Secondary | ICD-10-CM | POA: Diagnosis present

## 2023-09-27 IMAGING — MG MM DIGITAL SCREENING BILAT W/ TOMO AND CAD
8 series · 8 of 24 positions shown · non-contrast
Comparison: Previous exam(s).

CLINICAL DATA: Screening.

EXAM:
DIGITAL SCREENING BILATERAL MAMMOGRAM WITH TOMOSYNTHESIS AND CAD
TECHNIQUE: Bilateral screening digital craniocaudal and mediolateral oblique
mammograms were obtained. Bilateral screening digital breast
tomosynthesis was performed. The images were evaluated with
computer-aided detection.

[L MLO synth-2D]
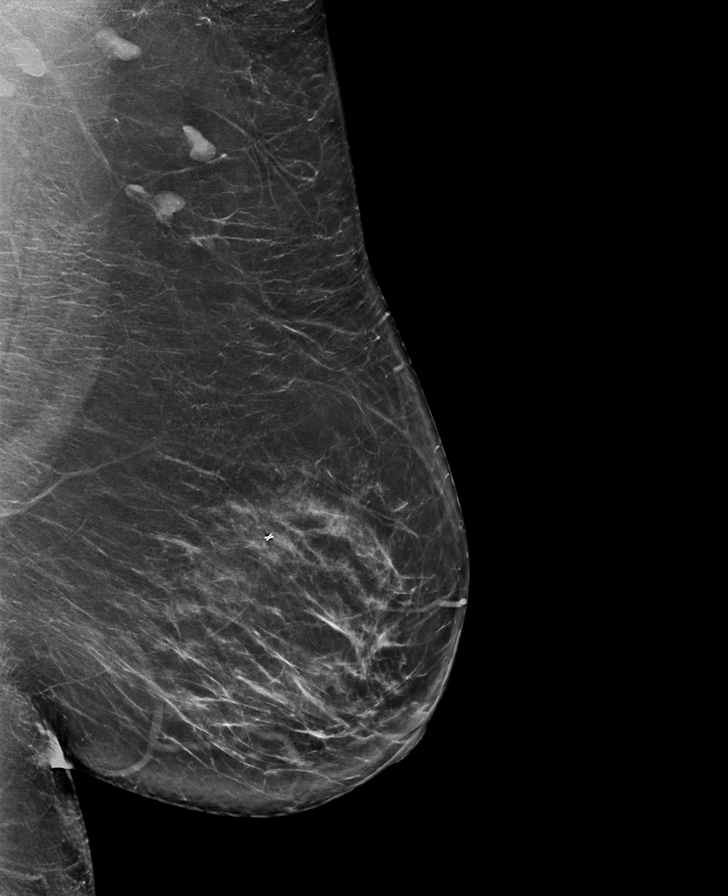

[R MLO synth-2D]
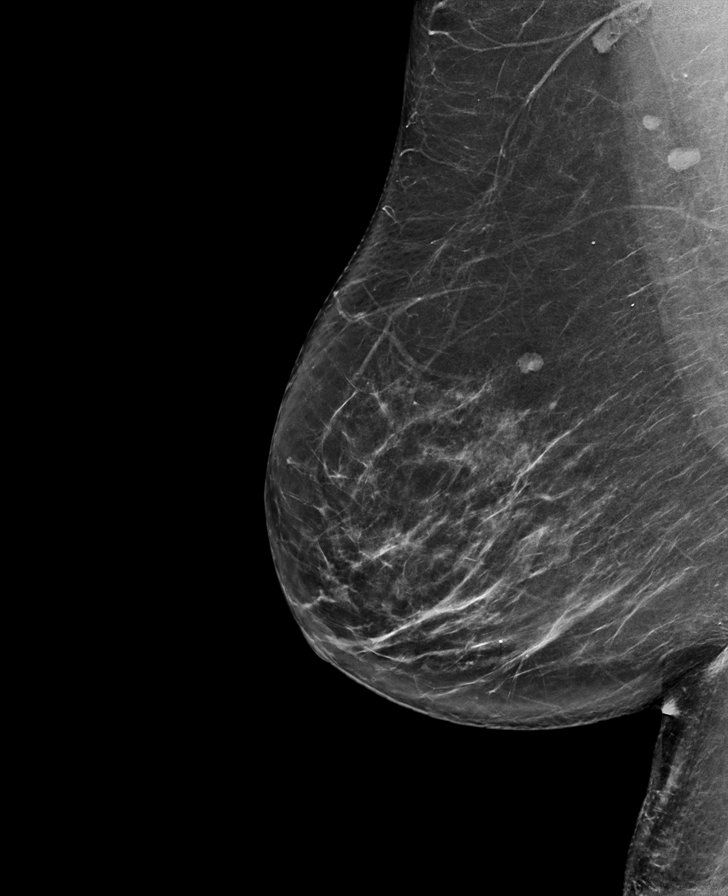

[R CC synth-2D]
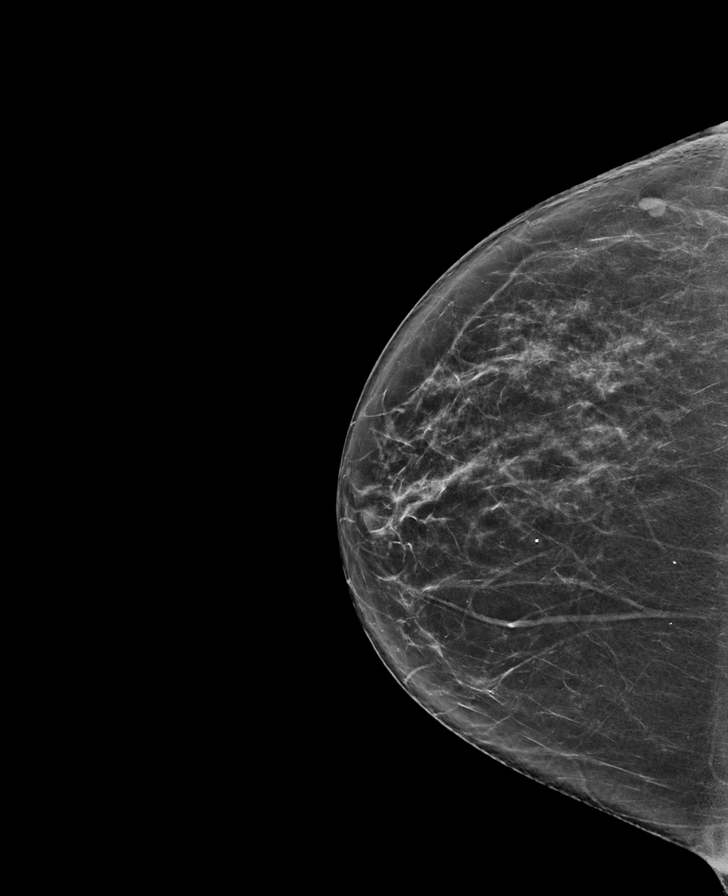

[L CC synth-2D]
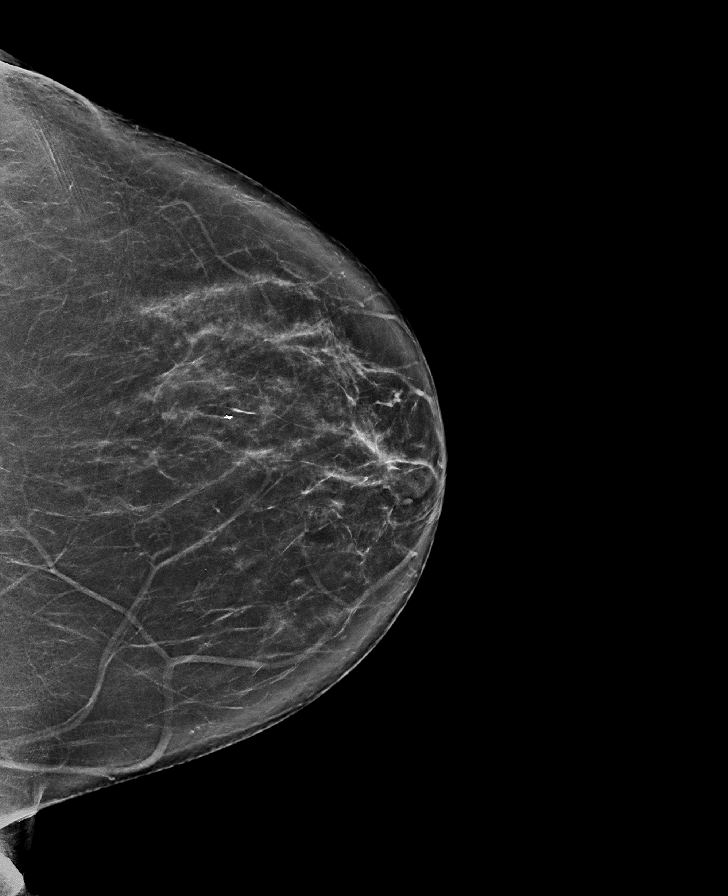

[L CC tomo · tomo slice 41/80.0]
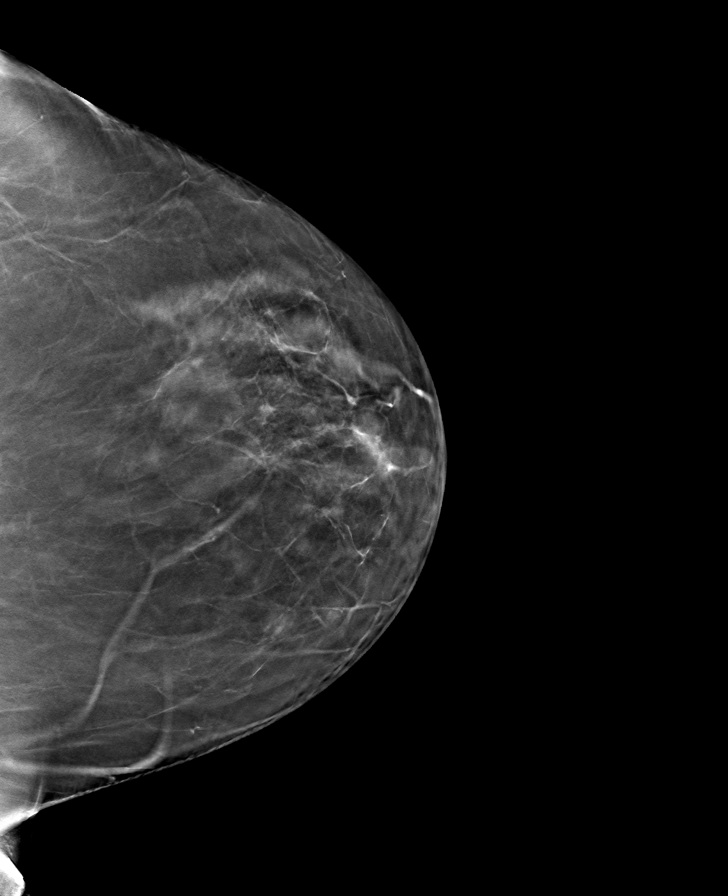

[R CC tomo · tomo slice 39/77.0]
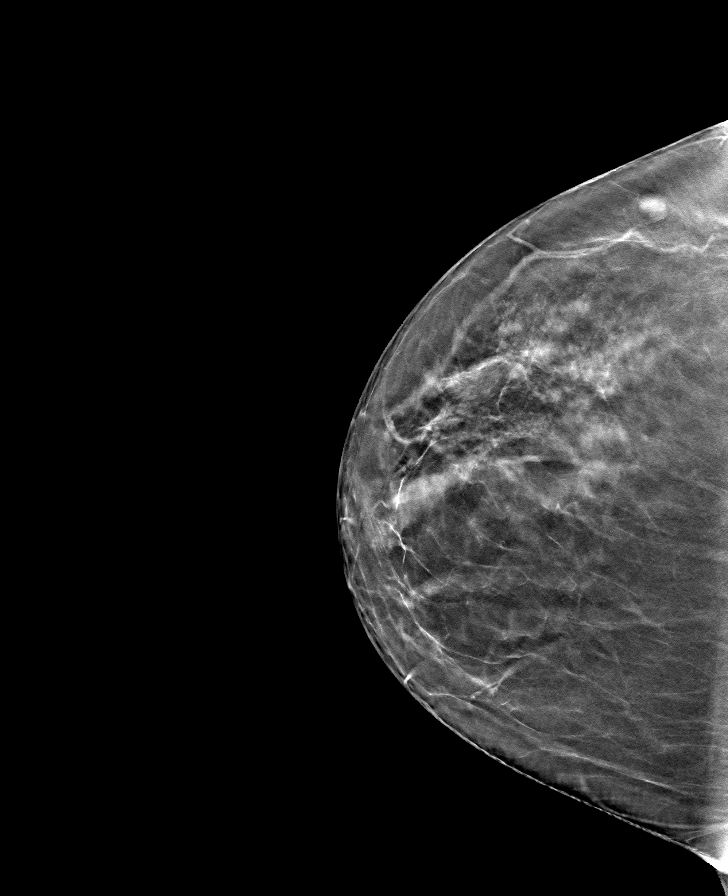

[L MLO tomo · tomo slice 45/89.0]
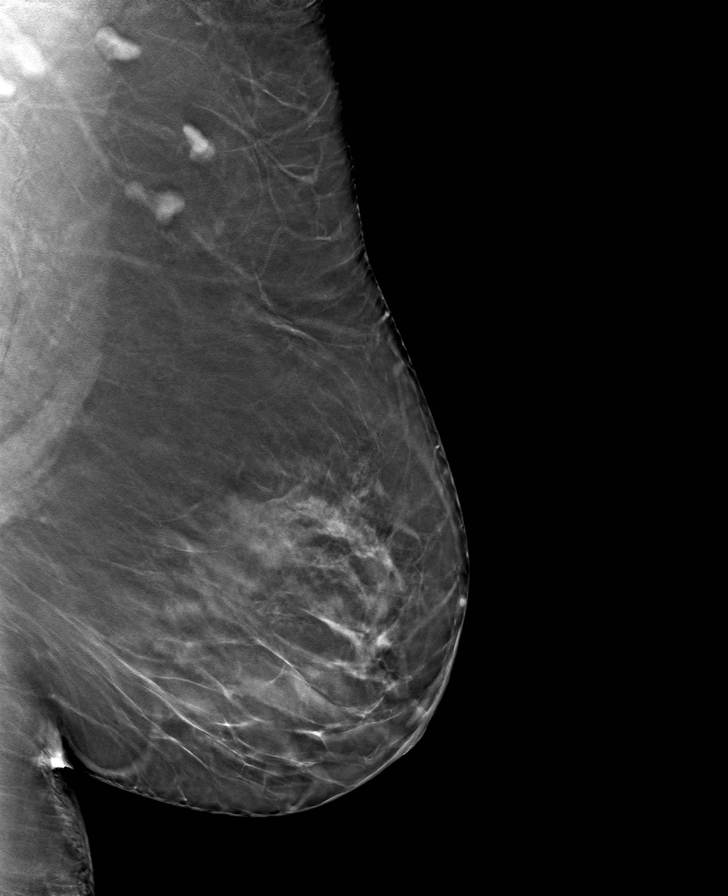

[R MLO tomo · tomo slice 43/85.0]
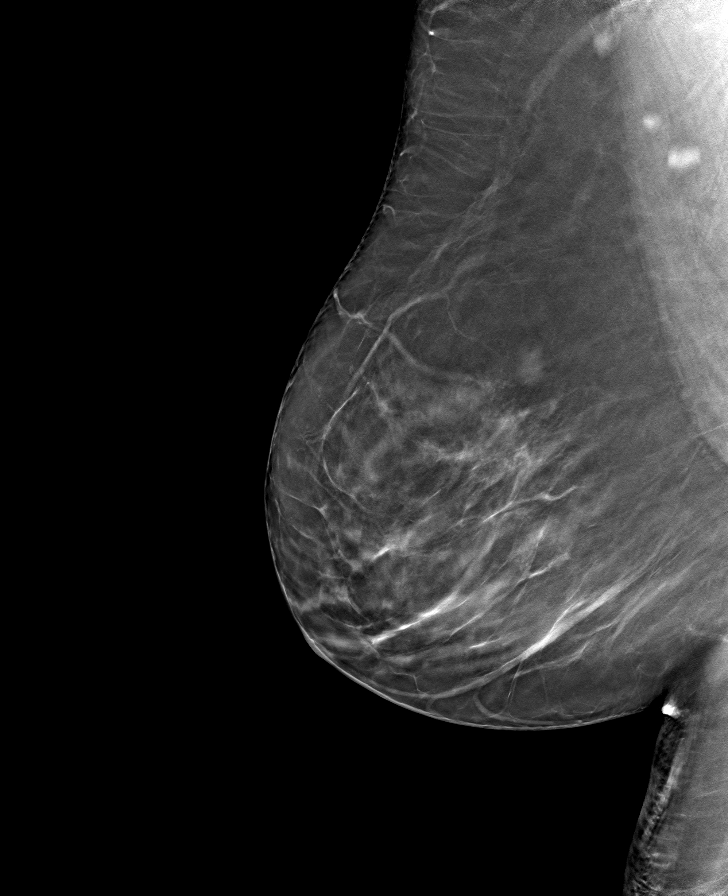

[8 of 24 positions shown; findings below may reference images not displayed]

ACR Breast Density Category b: There are scattered areas of
fibroglandular density.
FINDINGS: There are no findings suspicious for malignancy.
IMPRESSION: No mammographic evidence of malignancy. A result letter of this
screening mammogram will be mailed directly to the patient.

RECOMMENDATION:
Screening mammogram in one year. (Code:51-O-LD2)

BI-RADS CATEGORY  1: Negative.

## 2024-03-07 ENCOUNTER — Other Ambulatory Visit: Payer: Self-pay

## 2024-03-07 DIAGNOSIS — Z1231 Encounter for screening mammogram for malignant neoplasm of breast: Secondary | ICD-10-CM

## 2024-04-28 ENCOUNTER — Ambulatory Visit
Admission: RE | Admit: 2024-04-28 | Discharge: 2024-04-28 | Disposition: A | Discharge status: Home / Self Care | Payer: Self-pay | Source: Ambulatory Visit

## 2024-04-28 DIAGNOSIS — Z1231 Encounter for screening mammogram for malignant neoplasm of breast: Secondary | ICD-10-CM | POA: Insufficient documentation
